# Patient Record
Sex: Female | Born: 1970 | Race: White | Hispanic: No | Marital: Married | Smoking: Never smoker
Health system: Southern US, Community
[De-identification: ages and names within clinical notes are randomized; demographics above are authoritative.]

## PROBLEM LIST (undated history)

## (undated) DIAGNOSIS — I10 Essential (primary) hypertension: Secondary | ICD-10-CM

## (undated) DIAGNOSIS — F419 Anxiety disorder, unspecified: Secondary | ICD-10-CM

## (undated) HISTORY — PX: GANGLION CYST EXCISION: SHX1691

## (undated) HISTORY — PX: COLONOSCOPY: SHX174

## (undated) HISTORY — PX: WISDOM TOOTH EXTRACTION: SHX21

---

## 1999-05-16 ENCOUNTER — Other Ambulatory Visit: Admission: RE | Admit: 1999-05-16 | Discharge: 1999-05-16 | Payer: Self-pay | Admitting: Obstetrics and Gynecology

## 1999-12-05 ENCOUNTER — Other Ambulatory Visit: Admission: RE | Admit: 1999-12-05 | Discharge: 1999-12-05 | Payer: Self-pay | Admitting: Obstetrics and Gynecology

## 2000-09-24 ENCOUNTER — Other Ambulatory Visit: Admission: RE | Admit: 2000-09-24 | Discharge: 2000-09-24 | Payer: Self-pay | Admitting: Obstetrics and Gynecology

## 2001-04-04 ENCOUNTER — Other Ambulatory Visit: Admission: RE | Admit: 2001-04-04 | Discharge: 2001-04-04 | Payer: Self-pay | Admitting: Obstetrics and Gynecology

## 2001-11-17 ENCOUNTER — Other Ambulatory Visit: Admission: RE | Admit: 2001-11-17 | Discharge: 2001-11-17 | Payer: Self-pay | Admitting: Obstetrics and Gynecology

## 2002-11-30 ENCOUNTER — Other Ambulatory Visit: Admission: RE | Admit: 2002-11-30 | Discharge: 2002-11-30 | Payer: Self-pay | Admitting: Obstetrics and Gynecology

## 2011-08-01 ENCOUNTER — Other Ambulatory Visit (HOSPITAL_COMMUNITY)
Admission: RE | Admit: 2011-08-01 | Discharge: 2011-08-01 | Disposition: A | Discharge status: Home / Self Care | Payer: BC Managed Care – PPO | Source: Ambulatory Visit | Attending: Family Medicine | Admitting: Family Medicine

## 2011-08-01 DIAGNOSIS — Z01419 Encounter for gynecological examination (general) (routine) without abnormal findings: Secondary | ICD-10-CM | POA: Insufficient documentation

## 2011-12-06 ENCOUNTER — Encounter (HOSPITAL_COMMUNITY): Payer: Self-pay | Admitting: Pharmacist

## 2011-12-11 ENCOUNTER — Other Ambulatory Visit: Payer: Self-pay | Admitting: Obstetrics and Gynecology

## 2011-12-13 ENCOUNTER — Inpatient Hospital Stay (HOSPITAL_COMMUNITY): Admission: RE | Admit: 2011-12-13 | Payer: BC Managed Care – PPO | Source: Ambulatory Visit

## 2011-12-19 ENCOUNTER — Encounter (HOSPITAL_COMMUNITY): Payer: Self-pay

## 2011-12-19 ENCOUNTER — Encounter (HOSPITAL_COMMUNITY)
Admission: RE | Admit: 2011-12-19 | Discharge: 2011-12-19 | Disposition: A | Discharge status: Home / Self Care | Payer: BC Managed Care – PPO | Source: Ambulatory Visit | Attending: Obstetrics and Gynecology | Admitting: Obstetrics and Gynecology

## 2011-12-19 HISTORY — DX: Anxiety disorder, unspecified: F41.9

## 2011-12-19 HISTORY — DX: Essential (primary) hypertension: I10

## 2011-12-19 LAB — CBC
MCH: 28.5 pg (ref 26.0–34.0)
MCHC: 33.5 g/dL (ref 30.0–36.0)
Platelets: 247 10*3/uL (ref 150–400)
RDW: 12.3 % (ref 11.5–15.5)

## 2011-12-19 LAB — BASIC METABOLIC PANEL
BUN: 14 mg/dL (ref 6–23)
Calcium: 9.9 mg/dL (ref 8.4–10.5)
GFR calc non Af Amer: 78 mL/min — ABNORMAL LOW (ref >90)
Glucose, Bld: 90 mg/dL (ref 70–99)
Sodium: 137 mEq/L (ref 135–145)

## 2011-12-19 NOTE — Patient Instructions (Addendum)
YOUR PROCEDURE IS SCHEDULED ON:12/20/11  ENTER THROUGH THE MAIN ENTRANCE OF [facility] AT:10 am  USE DESK PHONE AND DIAL [phone] TO INFORM us OF YOUR ARRIVAL  CALL [phone] IF YOU HAVE ANY QUESTIONS OR PROBLEMS PRIOR TO YOUR ARRIVAL.  REMEMBER: DO NOT EAT OR DRINK AFTER MIDNIGHT : tonight  SPECIAL INSTRUCTIONS:   YOU MAY BRUSH YOUR TEETH THE MORNING OF SURGERY   TAKE THESE MEDICINES THE DAY OF SURGERY WITH SIP OF WATER:BP pill   DO NOT WEAR JEWELRY, EYE MAKEUP, LIPSTICK OR DARK FINGERNAIL POLISH DO NOT WEAR LOTIONS  DO NOT SHAVE FOR 48 HOURS PRIOR TO SURGERY  YOU WILL NOT BE ALLOWED TO DRIVE YOURSELF HOME.  NAME OF DRIVER:James- spouse- [phone]

## 2011-12-20 ENCOUNTER — Encounter (HOSPITAL_COMMUNITY): Payer: Self-pay | Admitting: Anesthesiology

## 2011-12-20 ENCOUNTER — Encounter (HOSPITAL_COMMUNITY)
Admission: RE | Disposition: A | Discharge status: Home / Self Care | Payer: Self-pay | Source: Ambulatory Visit | Attending: Obstetrics and Gynecology

## 2011-12-20 ENCOUNTER — Ambulatory Visit (HOSPITAL_COMMUNITY): Payer: BC Managed Care – PPO | Admitting: Anesthesiology

## 2011-12-20 ENCOUNTER — Ambulatory Visit (HOSPITAL_COMMUNITY)
Admission: RE | Admit: 2011-12-20 | Discharge: 2011-12-20 | Disposition: A | Discharge status: Home / Self Care | Payer: BC Managed Care – PPO | Source: Ambulatory Visit | Attending: Obstetrics and Gynecology | Admitting: Obstetrics and Gynecology

## 2011-12-20 DIAGNOSIS — Z01812 Encounter for preprocedural laboratory examination: Secondary | ICD-10-CM | POA: Insufficient documentation

## 2011-12-20 DIAGNOSIS — D071 Carcinoma in situ of vulva: Secondary | ICD-10-CM | POA: Insufficient documentation

## 2011-12-20 DIAGNOSIS — Z01818 Encounter for other preprocedural examination: Secondary | ICD-10-CM | POA: Insufficient documentation

## 2011-12-20 HISTORY — PX: VULVA /PERINEUM BIOPSY: SHX319

## 2011-12-20 SURGERY — BIOPSY, VULVA
Anesthesia: General | Site: Vagina | Wound class: Clean Contaminated

## 2011-12-20 MED ORDER — BUPIVACAINE-EPINEPHRINE 0.5% -1:200000 IJ SOLN
INTRAMUSCULAR | Status: DC | PRN
  Administered 2011-12-20: 6 mL

## 2011-12-20 MED ORDER — PROPOFOL 10 MG/ML IV EMUL
INTRAVENOUS | Status: DC | PRN
  Administered 2011-12-20: 200 mg via INTRAVENOUS

## 2011-12-20 MED ORDER — PROPOFOL 10 MG/ML IV EMUL
INTRAVENOUS | Status: AC
  Filled 2011-12-20: qty 20

## 2011-12-20 MED ORDER — MORPHINE SULFATE 4 MG/ML IJ SOLN: 0.0500 mg/kg | INTRAMUSCULAR | Status: DC | PRN

## 2011-12-20 MED ORDER — CEFAZOLIN SODIUM 1-5 GM-% IV SOLN
INTRAVENOUS | Status: AC
  Filled 2011-12-20: qty 50

## 2011-12-20 MED ORDER — LIDOCAINE HCL (CARDIAC) 20 MG/ML IV SOLN
INTRAVENOUS | Status: DC | PRN
  Administered 2011-12-20: 90 mg via INTRAVENOUS

## 2011-12-20 MED ORDER — ACETIC ACID 5 % SOLN
Status: DC | PRN
  Administered 2011-12-20: 1 via TOPICAL

## 2011-12-20 MED ORDER — ONDANSETRON HCL 4 MG/2ML IJ SOLN
INTRAMUSCULAR | Status: DC | PRN
  Administered 2011-12-20: 4 mg via INTRAVENOUS

## 2011-12-20 MED ORDER — ONDANSETRON HCL 4 MG/2ML IJ SOLN
INTRAMUSCULAR | Status: AC
  Filled 2011-12-20: qty 2

## 2011-12-20 MED ORDER — ONDANSETRON HCL 4 MG/2ML IJ SOLN: 4.0000 mg | Freq: Once | INTRAMUSCULAR | Status: DC | PRN

## 2011-12-20 MED ORDER — LACTATED RINGERS IV SOLN
INTRAVENOUS | Status: DC
  Administered 2011-12-20 (×2): via INTRAVENOUS

## 2011-12-20 MED ORDER — HYDROMORPHONE HCL PF 1 MG/ML IJ SOLN: 0.2500 mg | INTRAMUSCULAR | Status: DC | PRN

## 2011-12-20 MED ORDER — FENTANYL CITRATE 0.05 MG/ML IJ SOLN
INTRAMUSCULAR | Status: AC
  Filled 2011-12-20: qty 5

## 2011-12-20 MED ORDER — OXYCODONE-ACETAMINOPHEN 5-325 MG PO TABS: 1.0000 | ORAL_TABLET | ORAL | Status: AC | PRN

## 2011-12-20 MED ORDER — BUPIVACAINE-EPINEPHRINE (PF) 0.5% -1:200000 IJ SOLN
INTRAMUSCULAR | Status: AC
  Filled 2011-12-20: qty 10

## 2011-12-20 MED ORDER — LIDOCAINE HCL (CARDIAC) 20 MG/ML IV SOLN
INTRAVENOUS | Status: AC
  Filled 2011-12-20: qty 5

## 2011-12-20 MED ORDER — CEFAZOLIN SODIUM 1-5 GM-% IV SOLN
1.0000 g | INTRAVENOUS | Status: AC
  Administered 2011-12-20: 1 g via INTRAVENOUS

## 2011-12-20 MED ORDER — MIDAZOLAM HCL 2 MG/2ML IJ SOLN
INTRAMUSCULAR | Status: AC
  Filled 2011-12-20: qty 2

## 2011-12-20 MED ORDER — SUFENTANIL CITRATE 50 MCG/ML IV SOLN: INTRAVENOUS | Status: DC | PRN

## 2011-12-20 MED ORDER — MIDAZOLAM HCL 5 MG/5ML IJ SOLN
INTRAMUSCULAR | Status: DC | PRN
  Administered 2011-12-20: 2 mg via INTRAVENOUS

## 2011-12-20 MED ORDER — FENTANYL CITRATE 0.05 MG/ML IJ SOLN
INTRAMUSCULAR | Status: DC | PRN
  Administered 2011-12-20: 50 ug via INTRAVENOUS
  Administered 2011-12-20: 100 ug via INTRAVENOUS

## 2011-12-20 SURGICAL SUPPLY — 25 items
BLADE SURG 15 STRL LF C SS BP (BLADE) ×1 IMPLANT
BLADE SURG 15 STRL SS (BLADE) ×1
CATH ROBINSON RED A/P 16FR (CATHETERS) ×2 IMPLANT
CLOTH BEACON ORANGE TIMEOUT ST (SAFETY) ×2 IMPLANT
CONTAINER PREFILL 10% NBF 15ML (MISCELLANEOUS) IMPLANT
CONTAINER PREFILL 10% NBF 60ML (FORM) ×2 IMPLANT
COUNTER NEEDLE 1200 MAGNETIC (NEEDLE) ×2 IMPLANT
ELECT REM PT RETURN 9FT ADLT (ELECTROSURGICAL) ×2
ELECTRODE REM PT RTRN 9FT ADLT (ELECTROSURGICAL) ×1 IMPLANT
GLOVE ECLIPSE 7.0 STRL STRAW (GLOVE) ×4 IMPLANT
GOWN PREVENTION PLUS LG XLONG (DISPOSABLE) ×2 IMPLANT
GOWN PREVENTION PLUS XLARGE (GOWN DISPOSABLE) ×2 IMPLANT
NEEDLE HYPO 25X1 1.5 SAFETY (NEEDLE) ×2 IMPLANT
NEEDLE SPNL 22GX3.5 QUINCKE BK (NEEDLE) ×2 IMPLANT
NS IRRIG 1000ML POUR BTL (IV SOLUTION) ×2 IMPLANT
PACK VAGINAL MINOR WOMEN LF (CUSTOM PROCEDURE TRAY) ×2 IMPLANT
PAD PREP 24X48 CUFFED NSTRL (MISCELLANEOUS) ×2 IMPLANT
PENCIL BUTTON HOLSTER BLD 10FT (ELECTRODE) ×2 IMPLANT
SUT ETHILON 3 0 PS 1 18 (SUTURE) ×2 IMPLANT
SUT VIC AB 2-0 SH 27 (SUTURE) ×1
SUT VIC AB 2-0 SH 27XBRD (SUTURE) ×1 IMPLANT
SUT VICRYL RAPIDE 4/0 PS 2 (SUTURE) ×2 IMPLANT
SYR CONTROL 10ML LL (SYRINGE) ×2 IMPLANT
TOWEL OR 17X24 6PK STRL BLUE (TOWEL DISPOSABLE) ×4 IMPLANT
WATER STERILE IRR 1000ML POUR (IV SOLUTION) ×2 IMPLANT

## 2011-12-20 NOTE — Discharge Instructions (Signed)
Disorders of the Vulva It is important to know the outside (external) area of the female genitalia to properly examine yourself. The vulva or labia, sometimes also called the lips around the vagina, covers the pubic bone and surrounds the vagina. The larger or outer labia is called the labia majora. The inner or smaller labia is called the labia minora. The clitoris is at the top of the vulva. It is covered by a small area of tissue called the hood. Below the clitoris is the opening of the tube from the bladder, which is the urethral opening. Just below the urethral opening is the opening of the vagina. This area is called the vestibule. Inside the vestibule are bartholin and skene glands that lubricate the outside of the vagina during sexual intercourse. The perineum is the area that lies between the vagina and anus. You should examine your external genitalia once a month just as you do your self breast examination. SIGNS AND SYMPTOMS TO LOOK FOR DURING YOUR EXAMINATION:  Swelling.   Redness.   Bumps.   Blisters.   Black or white areas.   Itching.   Bleeding.   Burning.  TYPES OF VULVAR PROBLEMS  Infections.   Yeast (fungus) is the most common infection that causes redness, swelling, itching and a thick white vaginal discharge. Women with diabetes, taking medicines that kill germs (antibiotics) or on birth control pills are at risk for yeast infection. This infection is treated with vaginal creams, suppositories and anti-itch cream for the vulva.   Genital warts (condyloma) are caused by the human papilloma virus. They are raised bumps that can itch, bleed, cause discomfort and sometimes appear in groups like cauliflower. This is a sexually transmitted disease (STD) caused by sexual contact. This can be treated with topical medication, freezing, electrocautery, laser or surgical removal.   Genital herpes is a virus that causes redness, swelling, blisters and ulcers that can cause itching  and are very painful. This is also a STD. There are medications to control the symptoms of herpes, but there is no cure. Once you have it, you keep getting it because the virus stays in your body.   Contact dermatitis. Contact dermatitis is an irritation of the vulva that can cause redness, swelling and itching. It can be caused by:   Perfumed toilet paper.   Deodorants.   Talcum powder.   Hygiene sprays.   Tampons.   Soaps and fabric softeners.   Wearing wet underwear and bathing suits too long.   Spermicide.   Condoms.   Diaphragms.   Poison ivy.  Treatment will depend on eliminating the cause and treating the symptoms.  Vulvodynia.   Vulvodynia is "painful vulva." It includes burning, itching, irritation and a feeling of rawness or bruising of the vulva. Treating this problem depends on the cause and diagnosis. Treatment can take a long time.   Vulvar Dystrophy.   Vulvar Dystrophy is a disorder of the skin of the vulva. The skin can be too thick with hard raised patches or too thin skin showing wrinkles. Vulvar dystrophy can cause redness or white patches, itching and burning sensation. A biopsy may be needed to diagnose the problem. Treatment with creams or ointments depends on the diagnosis.   Vulvar Cancer.   Vulvar cancer is usually a form of squamus skin cancer. Other types of vulvar cancer like melenoma (a dark or black, irregular shaped mole that can bleed easily) and adenocarcinoma (red, itchy, scaly area that looks like eczema) are rare.  Treatment is usually surgery to remove the cancerous area, the vulva and the lymph nodes in the groin. This can be done with or without radiation and chemotherapy.  HOME CARE INSTRUCTIONS   Look at your vulva and external genital area every month.   Follow and finish all treatment given to you by your caregiver.   Do not use perfumed or scented soaps, detergents, hygiene spray, talcum powder, tampons, douches or toilet paper.     Remove your tampon every 6 hours. Do not leave tampons in overnight.   Wear loose-fitting clothing.   Wear underwear that is cotton.   Avoid spermicidal creams or foams that irritate you.  SEEK MEDICAL CARE IF:   You notice changes on your vulva such as redness, swelling, itching, color changes, bleeding, small bumps, blisters or any discomfort.   You develop an abnormal vaginal discharge.   You have painful sexual intercourse.   You notice swelling of the lymph nodes in your groin.  Document Released: 03/12/2008 Document Revised: 09/13/2011 Document Reviewed: 12/25/2010 [facility] Patient Information 2012 [place], [place].

## 2011-12-20 NOTE — Anesthesia Preprocedure Evaluation (Signed)
Anesthesia Evaluation  Patient identified by MRN, date of birth, ID band Patient awake    Reviewed: Allergy & Precautions, H&P , NPO status , Patient's Chart, lab work & pertinent test results  Airway Mallampati: I TM Distance: >3 FB Neck ROM: Full    Dental  (+) Teeth Intact and Dental Advisory Given   Pulmonary  breath sounds clear to auscultation        Cardiovascular Rhythm:Regular Rate:Normal     Neuro/Psych    GI/Hepatic   Endo/Other    Renal/GU      Musculoskeletal   Abdominal   Peds  Hematology   Anesthesia Other Findings   Reproductive/Obstetrics                           Anesthesia Physical Anesthesia Plan  ASA: II  Anesthesia Plan: General   Post-op Pain Management:    Induction: Intravenous  Airway Management Planned: LMA  Additional Equipment:   Intra-op Plan:   Post-operative Plan: Extubation in OR  Informed Consent: I have reviewed the patients History and Physical, chart, labs and discussed the procedure including the risks, benefits and alternatives for the proposed anesthesia with the patient or authorized representative who has indicated his/her understanding and acceptance.     Plan Discussed with: CRNA, Anesthesiologist and Surgeon  Anesthesia Plan Comments:         Anesthesia Quick Evaluation

## 2011-12-20 NOTE — Anesthesia Postprocedure Evaluation (Signed)
  Anesthesia Post-op Note  Patient: Brittney Gaines  Procedure(s) Performed: Procedure(s) (LRB): VULVAR BIOPSY (N/A)  Patient Location: PACU  Anesthesia Type: General  Level of Consciousness: awake, alert  and oriented  Airway and Oxygen Therapy: Patient Spontanous Breathing  Post-op Pain: none  Post-op Assessment: Post-op Vital signs reviewed, Patient's Cardiovascular Status Stable, Respiratory Function Stable, Patent Airway, No signs of Nausea or vomiting and Pain level controlled  Post-op Vital Signs: Reviewed and stable  Complications: No apparent anesthesia complications

## 2011-12-20 NOTE — H&P (Signed)
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 Recently had normal colonoscopy. PE: VSSAF HEENT- wnl ABD- soft, non tender. 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

## 2011-12-20 NOTE — Transfer of Care (Signed)
Immediate Anesthesia Transfer of Care Note  Patient: Brittney Gaines  Procedure(s) Performed: Procedure(s) (LRB): VULVAR BIOPSY (N/A)  Patient Location: PACU  Anesthesia Type: General  Level of Consciousness: patient cooperative  Airway & Oxygen Therapy: Patient connected to nasal cannula oxygen  Post-op Assessment: Report given to PACU RN  Post vital signs: Reviewed and stable  Complications: No apparent anesthesia complications

## 2011-12-21 NOTE — Op Note (Signed)
NAME:  Brittney Gaines, Brittney Gaines NO.:  [id number]  MEDICAL RECORD NO.:  [id number]  LOCATION:  [facility]                          FACILITY:  [facility]  PHYSICIAN:  Malva Limes, M.D.    DATE OF BIRTH:  1971-03-01  DATE OF PROCEDURE: DATE OF DISCHARGE:  12/20/2011                              OPERATIVE REPORT   PREOPERATIVE DIAGNOSIS:  Vulvar carcinoma in situ.  POSTOP DIAGNOSIS:  Vulvar carcinoma in situ.  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  SURGEON:  Malva Limes, MD  ANESTHESIA:  LMA with local.  ANTIBIOTICS:  Ancef 1 g.  DRAINS:  Red rubber catheter bladder.  ESTIMATED BLOOD LOSS:  Minimal.  SPECIMENS:  Wide local excision, sent to pathology.  COMPLICATIONS:  None.  PROCEDURE:  The patient was taken to the operating room, where she was placed in dorsal supine position.  A general anesthetic was administered without difficulty.  She was placed in the dorsal lithotomy position. She was prepped and draped in the usual fashion for this procedure.  The colposcope was used after acetic acid was applied to the posterior peritoneum.  The patient appeared to have area of abnormality approximately 2 x 2 cm.  This area was injected with 0.25% Marcaine with epinephrine.  The area was excised and subcuticular tissue cauterized with a Bovie.  The lower layers were closed with interrupted 2-0 Vicryl suture and the skin incision with interrupted 3-0 nylon sutures.  The patient will return in the office in 1 week to have these removed.  She will be sent home with Percocet to take p.r.n.  She will follow up in the office in 1 week.          ______________________________ Malva Limes, M.D.    MA/MEDQ  D:  12/20/2011  T:  12/21/2011  Job:  [id number]

## 2011-12-25 ENCOUNTER — Encounter (HOSPITAL_COMMUNITY): Payer: Self-pay | Admitting: Obstetrics and Gynecology

## 2012-12-16 ENCOUNTER — Other Ambulatory Visit: Payer: Self-pay

## 2012-12-16 DIAGNOSIS — Z1231 Encounter for screening mammogram for malignant neoplasm of breast: Secondary | ICD-10-CM

## 2013-01-14 ENCOUNTER — Ambulatory Visit
Admission: RE | Admit: 2013-01-14 | Discharge: 2013-01-14 | Disposition: A | Discharge status: Home / Self Care | Payer: BC Managed Care – PPO | Source: Ambulatory Visit

## 2013-01-14 DIAGNOSIS — Z1231 Encounter for screening mammogram for malignant neoplasm of breast: Secondary | ICD-10-CM

## 2013-11-23 ENCOUNTER — Other Ambulatory Visit: Payer: Self-pay | Admitting: Obstetrics and Gynecology

## 2013-11-23 DIAGNOSIS — N6323 Unspecified lump in the left breast, lower outer quadrant: Secondary | ICD-10-CM

## 2013-12-02 ENCOUNTER — Other Ambulatory Visit: Payer: BC Managed Care – PPO

## 2013-12-14 ENCOUNTER — Ambulatory Visit
Admission: RE | Admit: 2013-12-14 | Discharge: 2013-12-14 | Disposition: A | Discharge status: Home / Self Care | Payer: BC Managed Care – PPO | Source: Ambulatory Visit | Attending: Obstetrics and Gynecology | Admitting: Obstetrics and Gynecology

## 2013-12-14 ENCOUNTER — Ambulatory Visit
Admission: RE | Admit: 2013-12-14 | Discharge: 2013-12-14 | Disposition: A | Discharge status: Home / Self Care | Payer: Self-pay | Source: Ambulatory Visit | Attending: Obstetrics and Gynecology | Admitting: Obstetrics and Gynecology

## 2013-12-14 DIAGNOSIS — N6323 Unspecified lump in the left breast, lower outer quadrant: Secondary | ICD-10-CM

## 2014-01-18 ENCOUNTER — Other Ambulatory Visit: Payer: Self-pay

## 2014-01-18 DIAGNOSIS — Z1231 Encounter for screening mammogram for malignant neoplasm of breast: Secondary | ICD-10-CM

## 2014-02-09 ENCOUNTER — Ambulatory Visit
Admission: RE | Admit: 2014-02-09 | Discharge: 2014-02-09 | Disposition: A | Discharge status: Home / Self Care | Payer: BC Managed Care – PPO | Source: Ambulatory Visit

## 2014-02-09 ENCOUNTER — Encounter (INDEPENDENT_AMBULATORY_CARE_PROVIDER_SITE_OTHER): Payer: Self-pay

## 2014-02-09 DIAGNOSIS — Z1231 Encounter for screening mammogram for malignant neoplasm of breast: Secondary | ICD-10-CM

## 2014-02-11 ENCOUNTER — Other Ambulatory Visit: Payer: Self-pay | Admitting: Family Medicine

## 2014-02-11 DIAGNOSIS — R928 Other abnormal and inconclusive findings on diagnostic imaging of breast: Secondary | ICD-10-CM

## 2014-02-24 ENCOUNTER — Ambulatory Visit
Admission: RE | Admit: 2014-02-24 | Discharge: 2014-02-24 | Disposition: A | Discharge status: Home / Self Care | Payer: BC Managed Care – PPO | Source: Ambulatory Visit | Attending: Family Medicine | Admitting: Family Medicine

## 2014-02-24 DIAGNOSIS — R928 Other abnormal and inconclusive findings on diagnostic imaging of breast: Secondary | ICD-10-CM

## 2014-11-24 ENCOUNTER — Other Ambulatory Visit: Payer: Self-pay | Admitting: Obstetrics and Gynecology

## 2014-11-25 LAB — CYTOLOGY - PAP

## 2015-01-24 ENCOUNTER — Other Ambulatory Visit: Payer: Self-pay

## 2015-01-24 DIAGNOSIS — Z1231 Encounter for screening mammogram for malignant neoplasm of breast: Secondary | ICD-10-CM

## 2015-02-18 ENCOUNTER — Ambulatory Visit
Admission: RE | Admit: 2015-02-18 | Discharge: 2015-02-18 | Disposition: A | Discharge status: Home / Self Care | Payer: Managed Care, Other (non HMO) | Source: Ambulatory Visit

## 2015-02-18 DIAGNOSIS — Z1231 Encounter for screening mammogram for malignant neoplasm of breast: Secondary | ICD-10-CM

## 2015-12-22 ENCOUNTER — Other Ambulatory Visit: Payer: Self-pay | Admitting: Obstetrics and Gynecology

## 2015-12-23 LAB — CYTOLOGY - PAP

## 2016-07-17 IMAGING — MG MM SCREENING BREAST TOMO BILATERAL
8 series · 8 of 24 positions shown · non-contrast
Comparison: Previous exam(s).

CLINICAL DATA: Screening.

EXAM:
DIGITAL SCREENING BILATERAL MAMMOGRAM WITH 3D TOMO WITH CAD

[R CC]
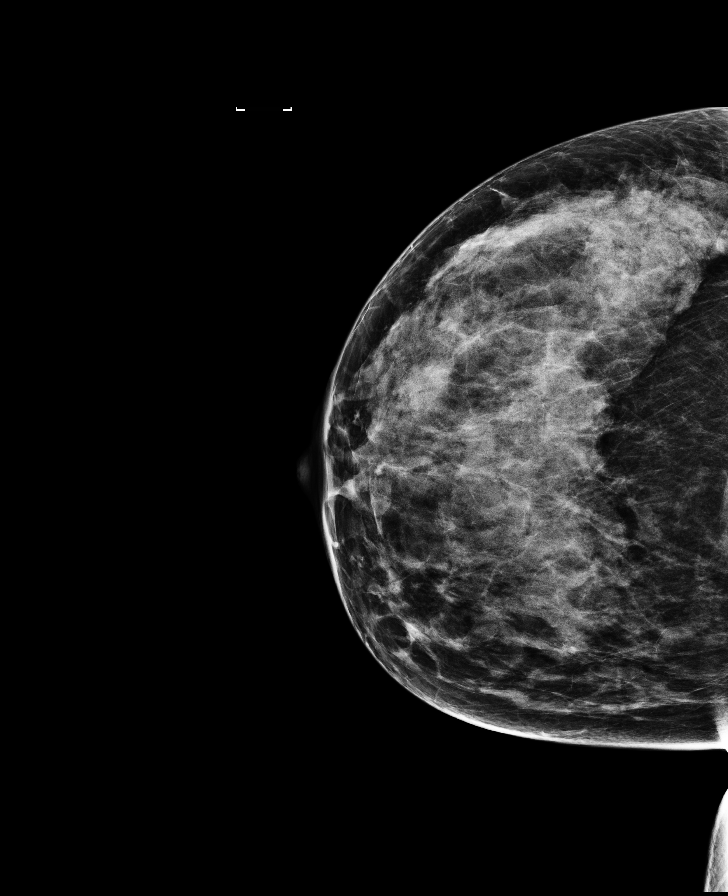

[R MLO]
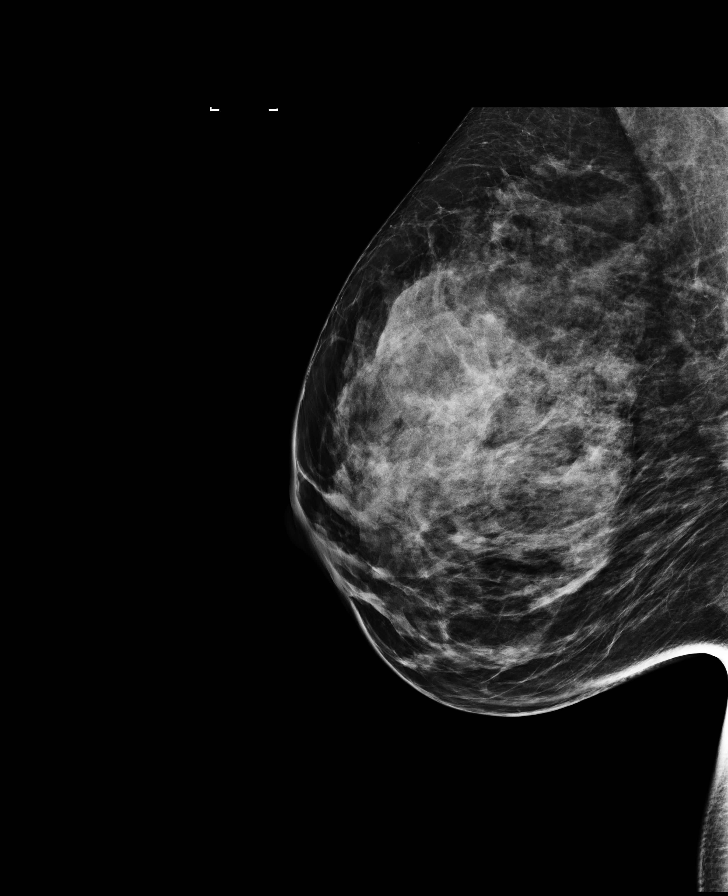

[L CC]
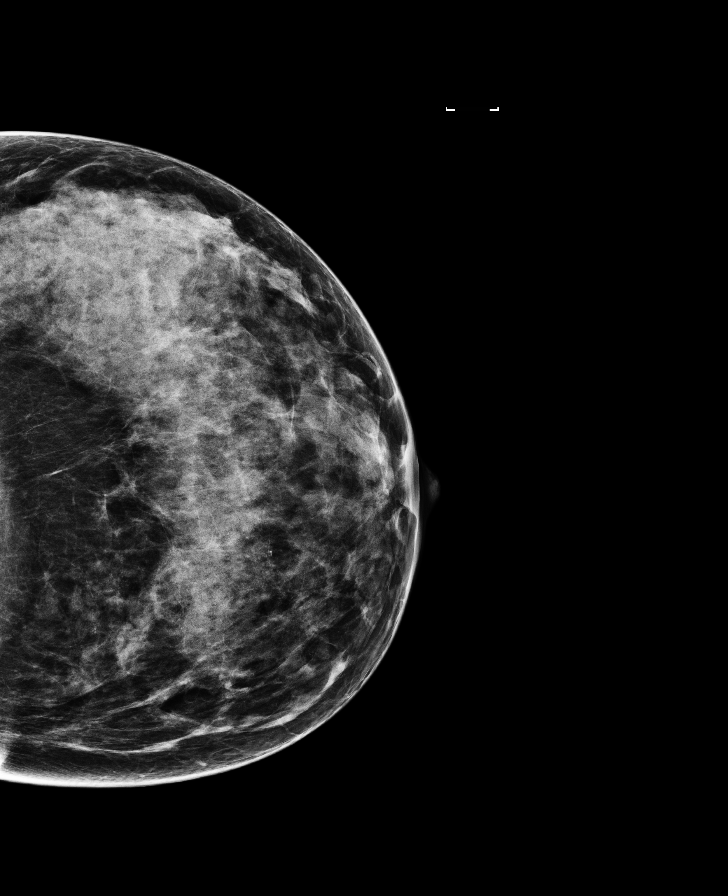

[L MLO]
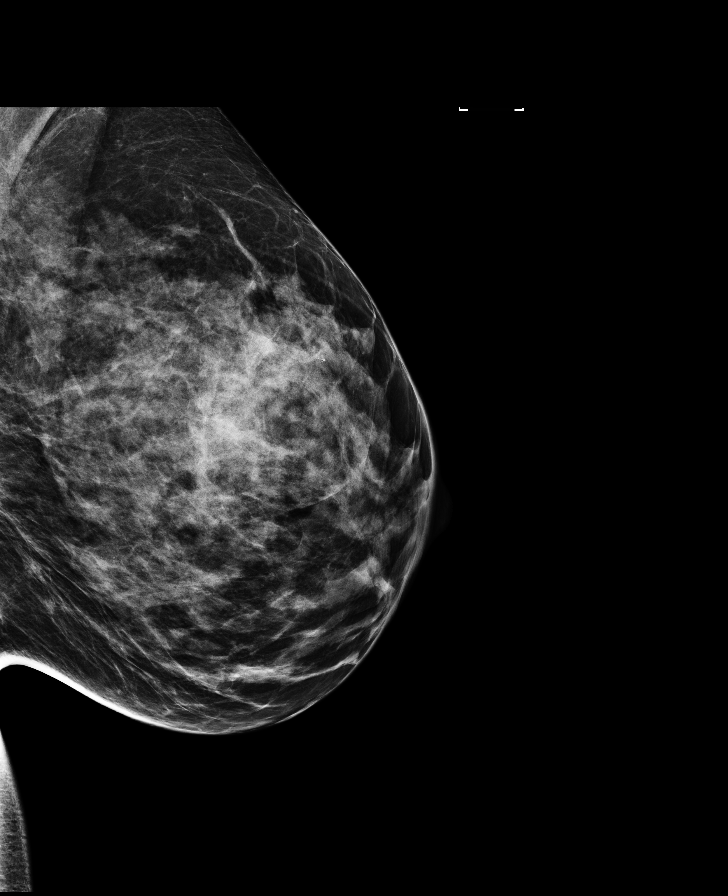

[R CC tomo · tomo slice 33/66.0]
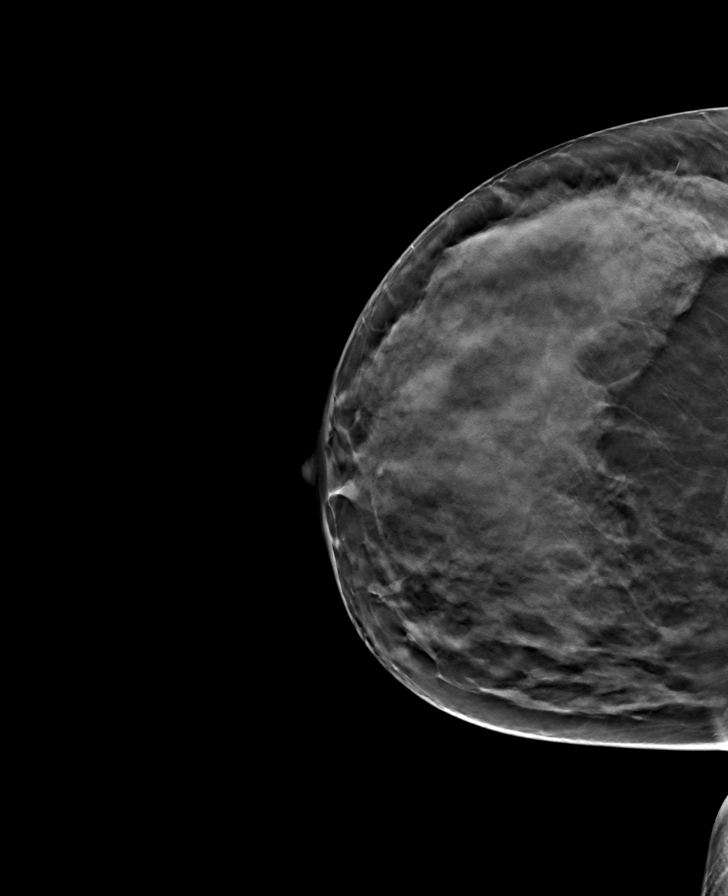

[L MLO tomo · tomo slice 35/70.0]
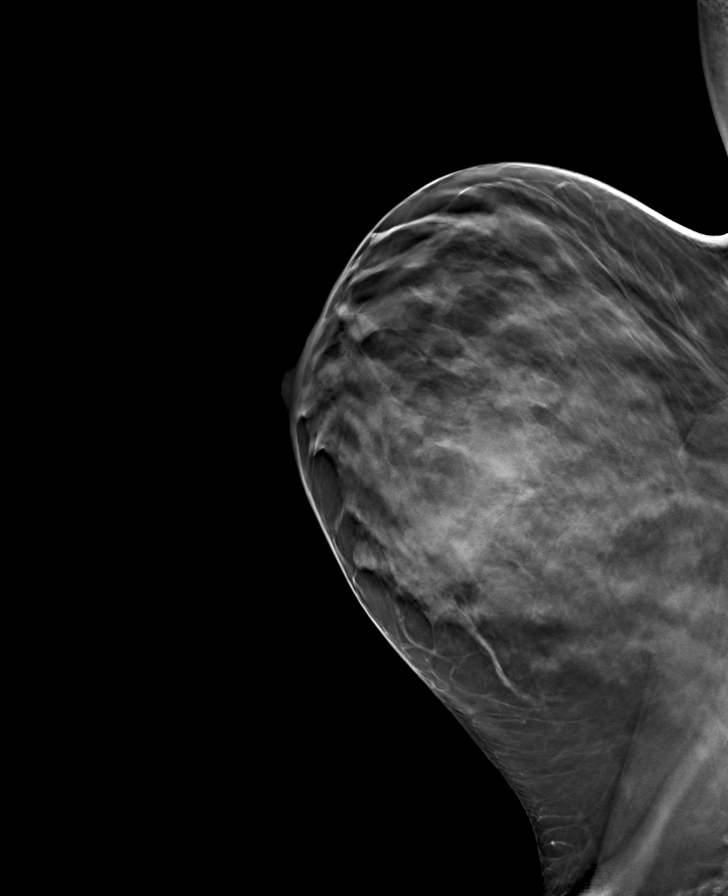

[R MLO tomo · tomo slice 36/71.0]
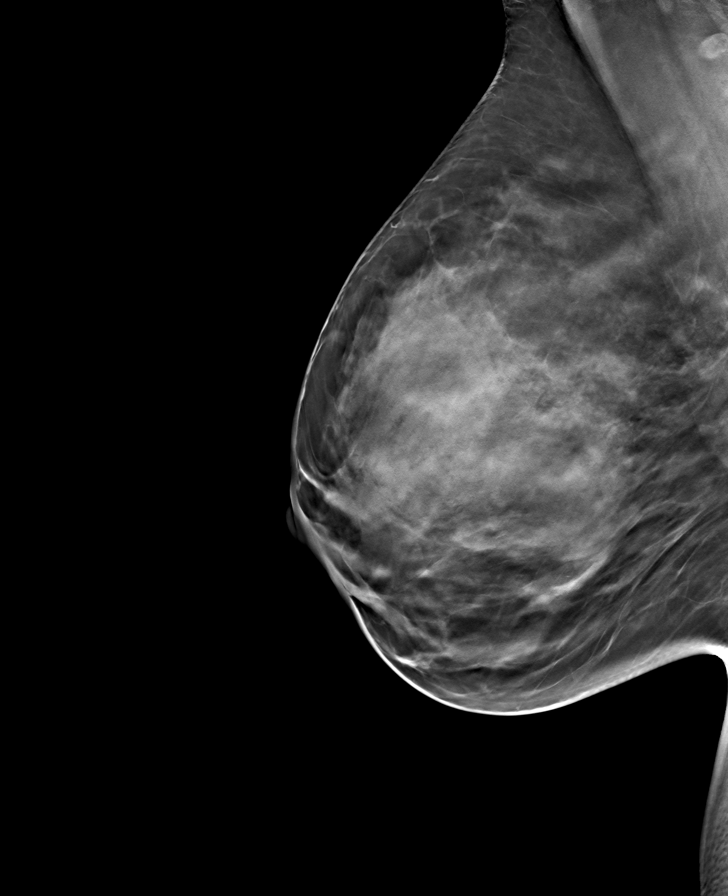

[L CC tomo · tomo slice 31/61.0]
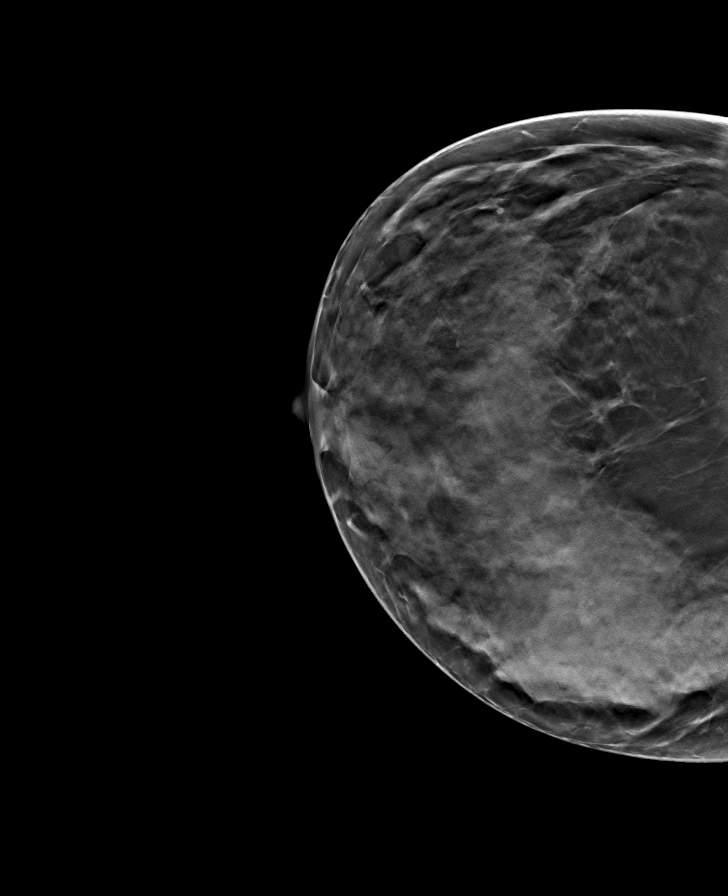

[8 of 24 positions shown; findings below may reference images not displayed]

ACR Breast Density Category d: The breast tissue is extremely dense,
which lowers the sensitivity of mammography.
FINDINGS: There are no findings suspicious for malignancy. Images were
processed with CAD.
IMPRESSION: No mammographic evidence of malignancy. A result letter of this
screening mammogram will be mailed directly to the patient.

RECOMMENDATION:
Screening mammogram in one year. (Code:JD-S-SN3)

BI-RADS CATEGORY  1: Negative.

## 2017-02-28 ENCOUNTER — Other Ambulatory Visit: Payer: Self-pay | Admitting: Obstetrics and Gynecology

## 2017-03-05 LAB — CYTOLOGY - PAP

## 2018-03-10 DIAGNOSIS — Z1231 Encounter for screening mammogram for malignant neoplasm of breast: Secondary | ICD-10-CM | POA: Diagnosis not present

## 2018-03-24 DIAGNOSIS — Z01419 Encounter for gynecological examination (general) (routine) without abnormal findings: Secondary | ICD-10-CM | POA: Diagnosis not present

## 2018-03-24 DIAGNOSIS — Z124 Encounter for screening for malignant neoplasm of cervix: Secondary | ICD-10-CM | POA: Diagnosis not present

## 2018-03-25 DIAGNOSIS — E785 Hyperlipidemia, unspecified: Secondary | ICD-10-CM | POA: Diagnosis not present

## 2018-03-25 DIAGNOSIS — I10 Essential (primary) hypertension: Secondary | ICD-10-CM | POA: Diagnosis not present

## 2018-09-11 DIAGNOSIS — G43109 Migraine with aura, not intractable, without status migrainosus: Secondary | ICD-10-CM | POA: Diagnosis not present

## 2018-12-02 DIAGNOSIS — M25531 Pain in right wrist: Secondary | ICD-10-CM | POA: Diagnosis not present

## 2018-12-02 DIAGNOSIS — M25511 Pain in right shoulder: Secondary | ICD-10-CM | POA: Diagnosis not present
